# Patient Record
Sex: Male | Born: 1975 | Race: White | Hispanic: No | Marital: Married | Smoking: Never smoker
Health system: Southern US, Community
[De-identification: ages and names within clinical notes are randomized; demographics above are authoritative.]

## PROBLEM LIST (undated history)

## (undated) DIAGNOSIS — F909 Attention-deficit hyperactivity disorder, unspecified type: Secondary | ICD-10-CM

## (undated) DIAGNOSIS — J329 Chronic sinusitis, unspecified: Secondary | ICD-10-CM

## (undated) DIAGNOSIS — K219 Gastro-esophageal reflux disease without esophagitis: Secondary | ICD-10-CM

## (undated) HISTORY — PX: APPENDECTOMY: SHX54

---

## 2011-08-22 ENCOUNTER — Ambulatory Visit: Payer: Self-pay

## 2011-08-22 ENCOUNTER — Ambulatory Visit: Payer: Self-pay | Admitting: Emergency Medicine

## 2011-08-22 LAB — URINALYSIS, COMPLETE
Bacteria: NEGATIVE
Bilirubin,UR: NEGATIVE
Glucose,UR: NEGATIVE mg/dL (ref 0–75)
Ketone: NEGATIVE
Leukocyte Esterase: NEGATIVE
Nitrite: NEGATIVE
Ph: 6 (ref 4.5–8.0)
Protein: 30
Specific Gravity: >=1.03 (ref 1.003–1.030)
Squamous Epithelial: NONE SEEN

## 2011-08-23 LAB — URINE CULTURE

## 2012-01-20 ENCOUNTER — Ambulatory Visit: Payer: Self-pay | Admitting: Internal Medicine

## 2012-02-06 ENCOUNTER — Ambulatory Visit: Payer: Self-pay | Admitting: Internal Medicine

## 2012-02-16 ENCOUNTER — Ambulatory Visit: Payer: Self-pay

## 2012-02-16 LAB — RAPID STREP-A WITH REFLX: Micro Text Report: NEGATIVE

## 2012-04-17 ENCOUNTER — Ambulatory Visit: Payer: Self-pay | Admitting: Emergency Medicine

## 2013-12-05 IMAGING — US US PELVIS LIMITED
1 series · 14 of 25 positions shown · non-contrast
Comparison: none

REASON FOR EXAM: CR [id number] Sudden Rt Groin Pa[REDACTED]reased
Cremasteric  Reflex Rt Testicl...
COMMENTS:

[Series 1: us pelvis limited · 0.08mm/px · 14 of 78 slices shown]
[im 1/78]
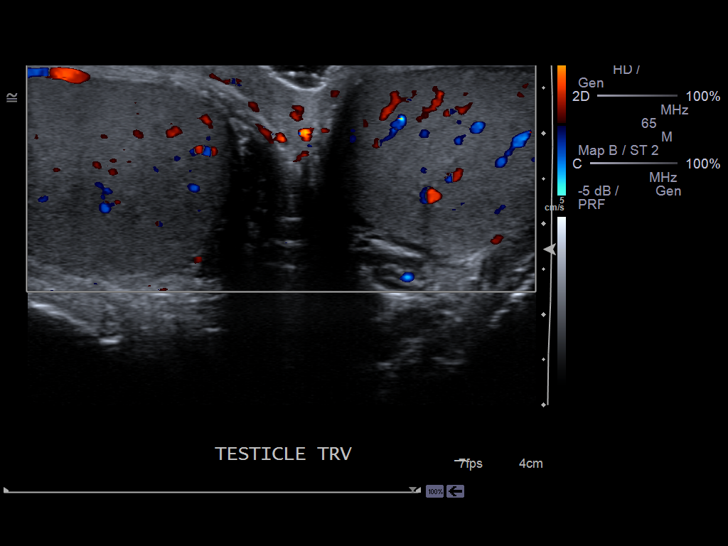
[im 7/78]
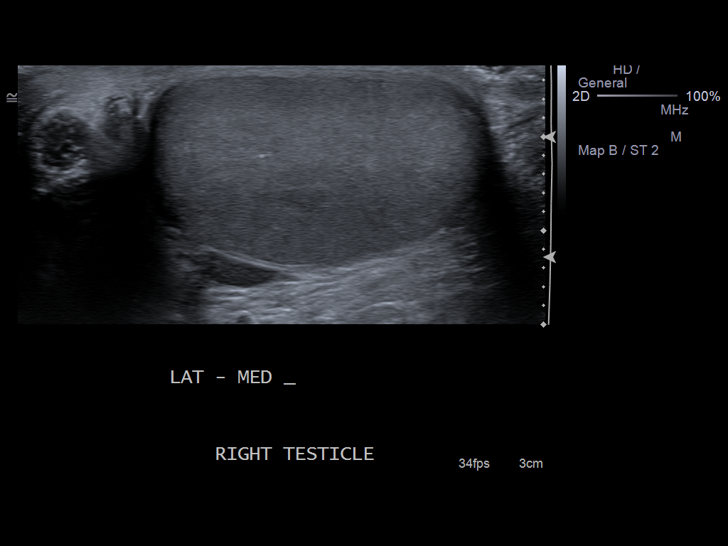
[im 13/78]
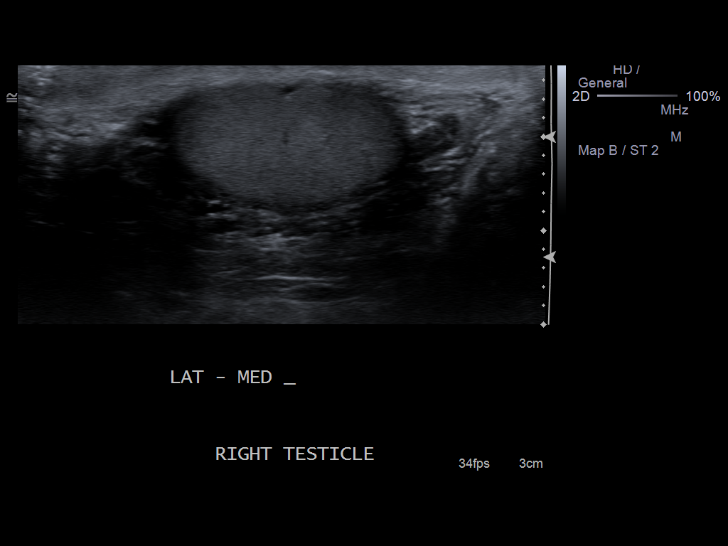
[im 20/78]
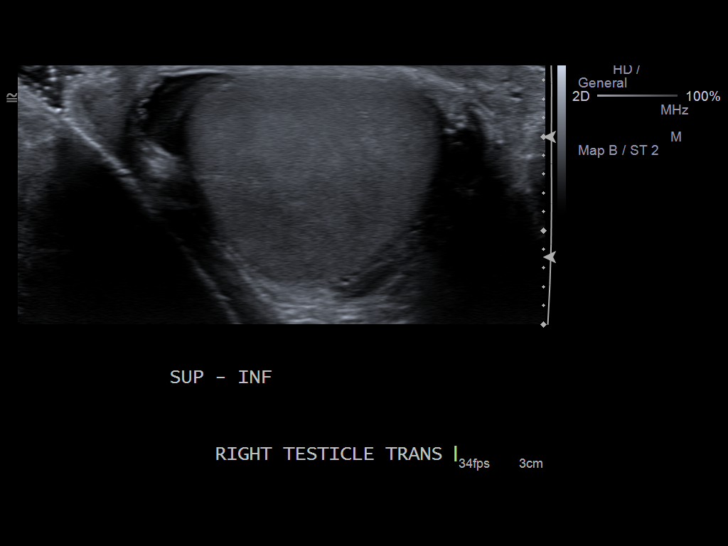
[im 26/78]
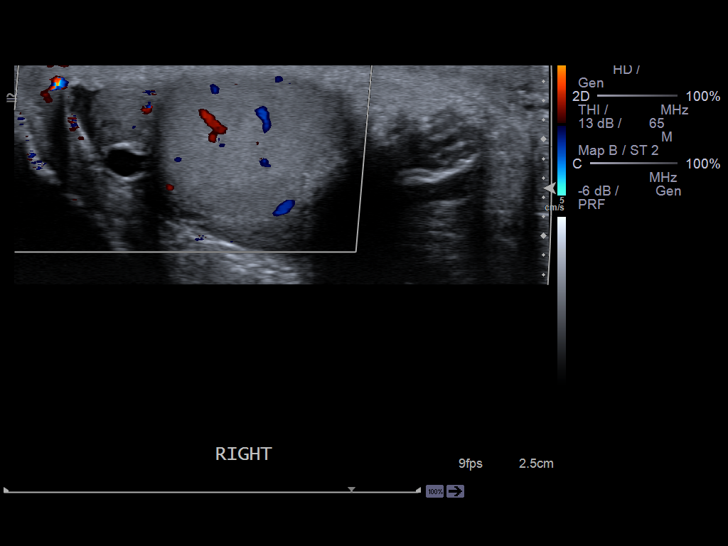
[im 29/78]
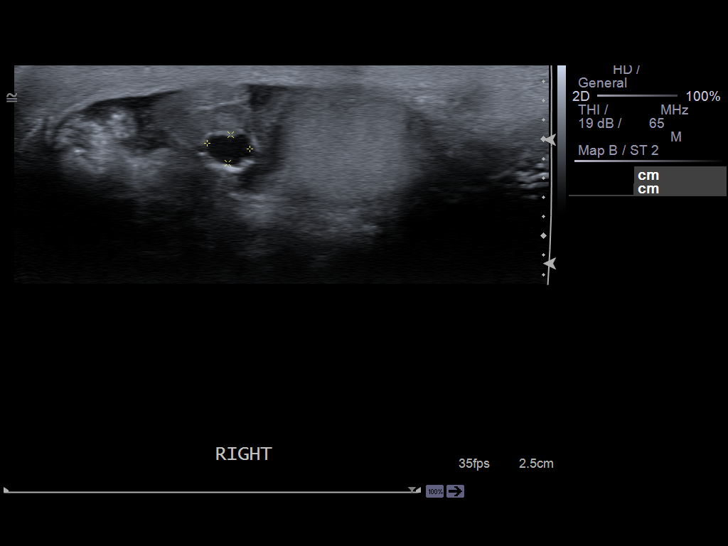
[im 36/78]
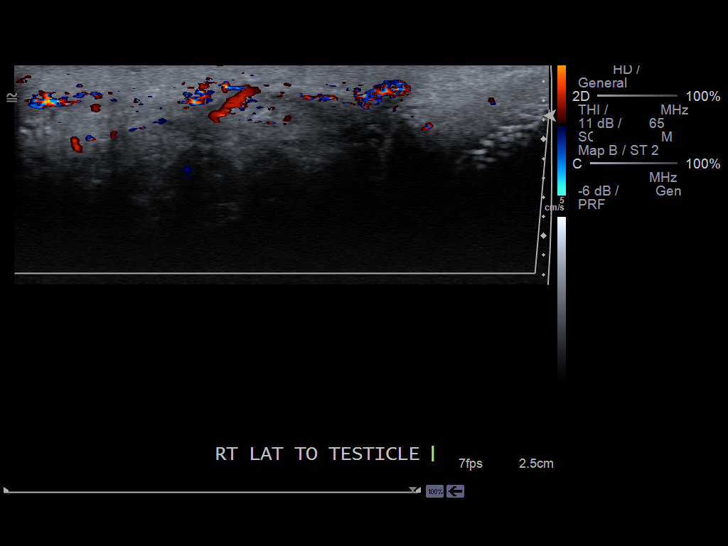
[im 42/78]
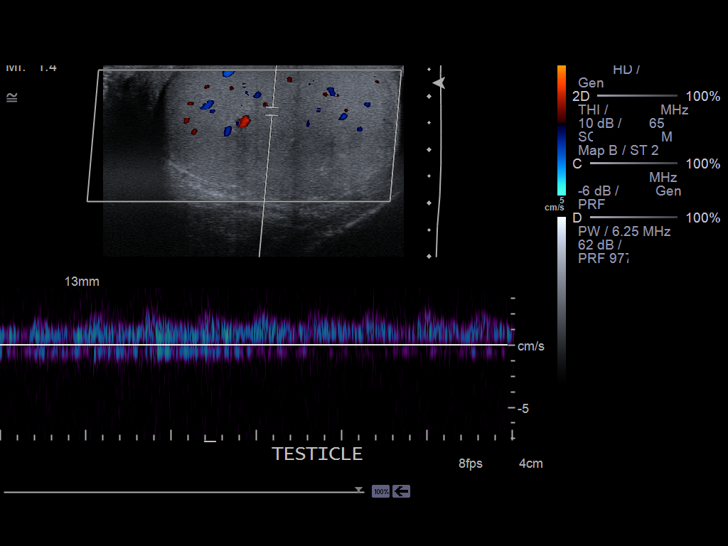
[im 49/78]
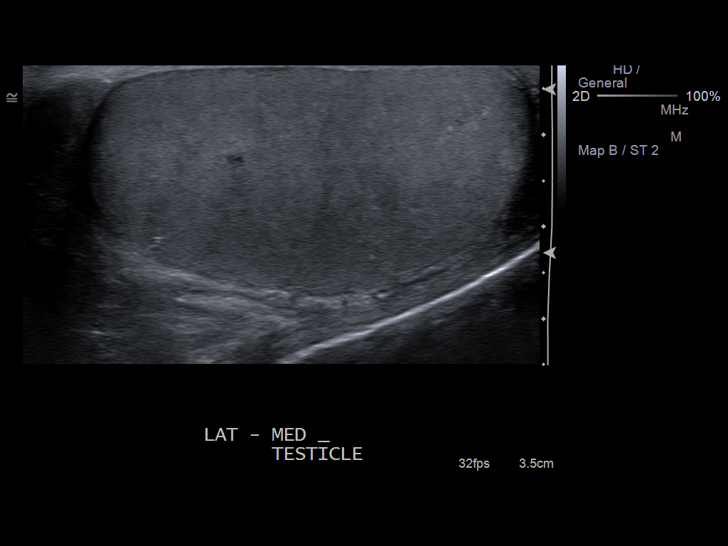
[im 52/78]
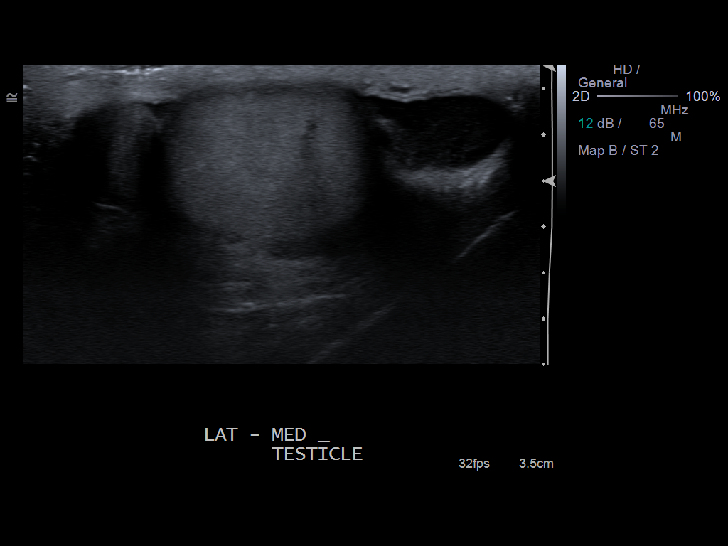
[im 58/78]
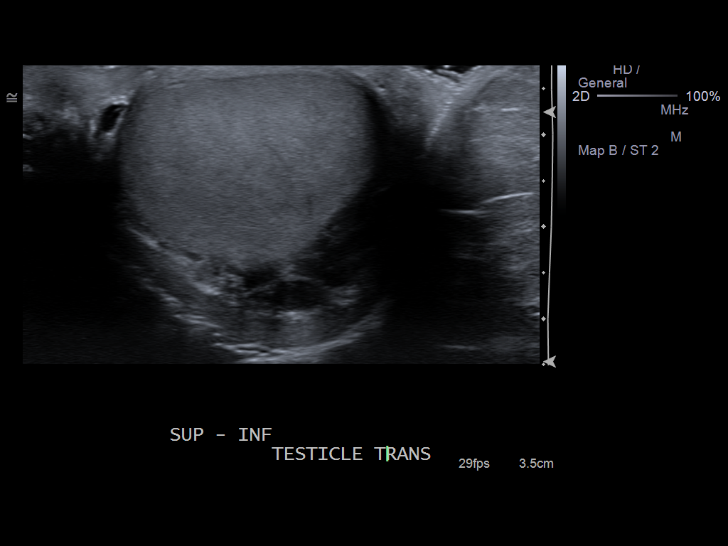
[im 65/78]
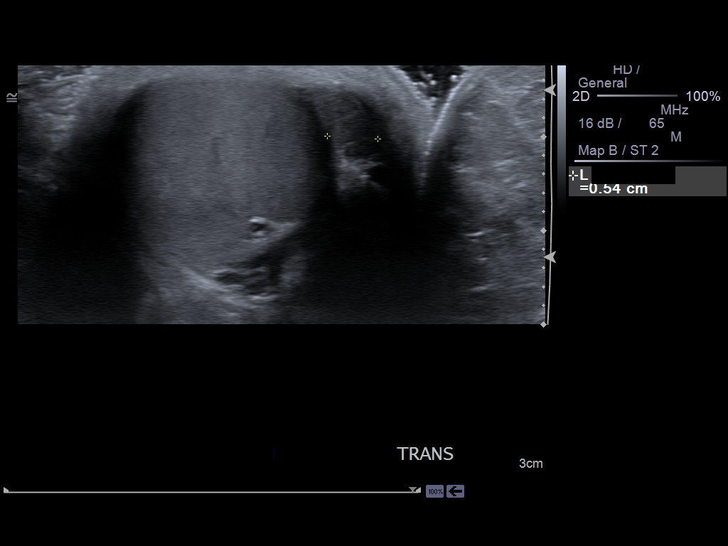
[im 71/78]
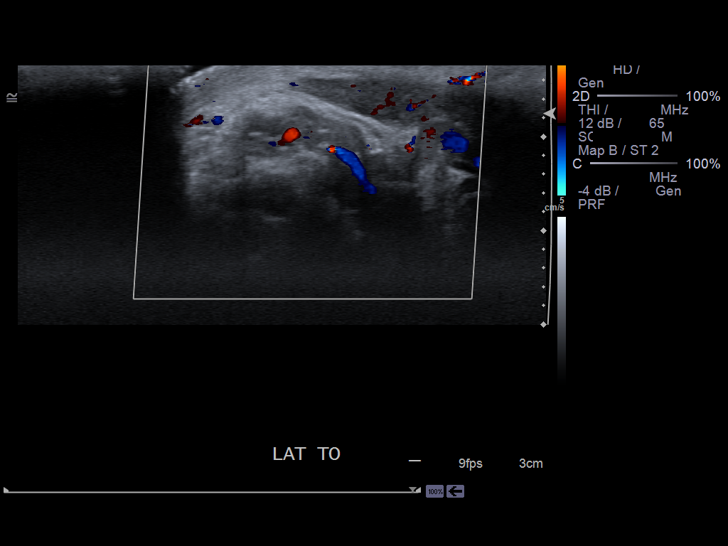
[im 78/78]
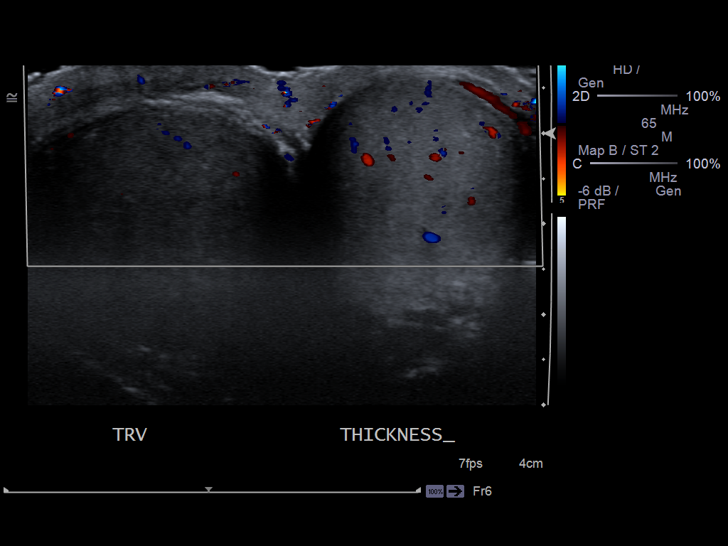

[14 of 25 positions shown; findings below may reference images not displayed]

PROCEDURE:     US  - US TESTICULAR  - August 22, 2011  [DATE]

RESULT:     The right testicle measures 4.79 cm x 3.56 cm x 2.37 cm and the
left testicle measures 4.59 cm x 2.73 cm x 2.56 cm. No intratesticular mass
is seen. Symmetrical vascular flow is observed in the testicles. No torsion
is identified. The epididymis is normal in size bilaterally. Incidental note
is made of a 1.1 cm, right epididymal cyst. A small, left varicocele is
observed.
IMPRESSION: 1.  No torsion or other acute change is identified.
2.  There is small, left varicocele.
3.  Incidental note is made of a 1.1 cm epididymal cyst on the right.

[REDACTED]

## 2014-05-05 IMAGING — CR RIGHT FOOT COMPLETE - 3+ VIEW
1 series · 3 of 3 positions shown · non-contrast
Comparison: none

REASON FOR EXAM: R foot laceration between 3rd and 4th toe due to injury
COMMENTS:

PROCEDURE:     MDR - MDR FOOT RT COMP W/OBLIQUES  - January 20, 2012 [DATE]
RESULT:     Right foot images demonstrate no definite fracture, dislocation
or radiopaque foreign body.

[Series 1: ap · 0.17mm/px · 3 of 3 slices shown]
[im 1/3]
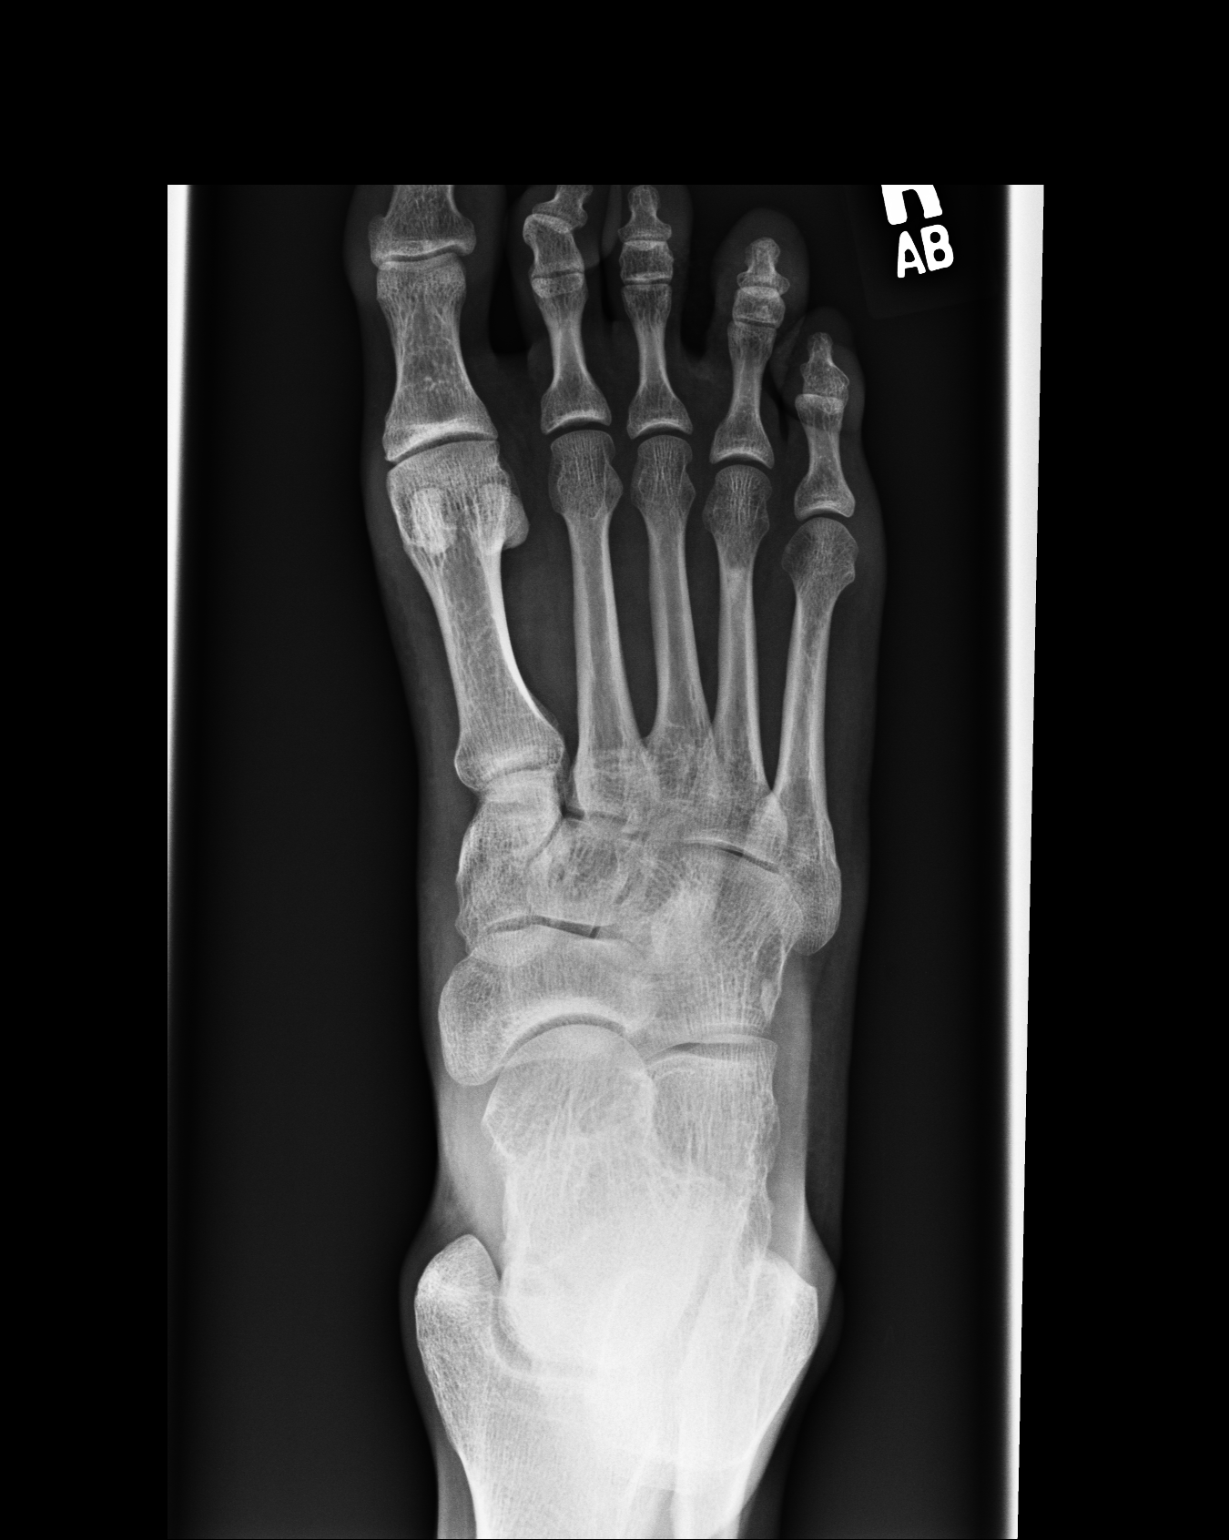
[im 2/3]
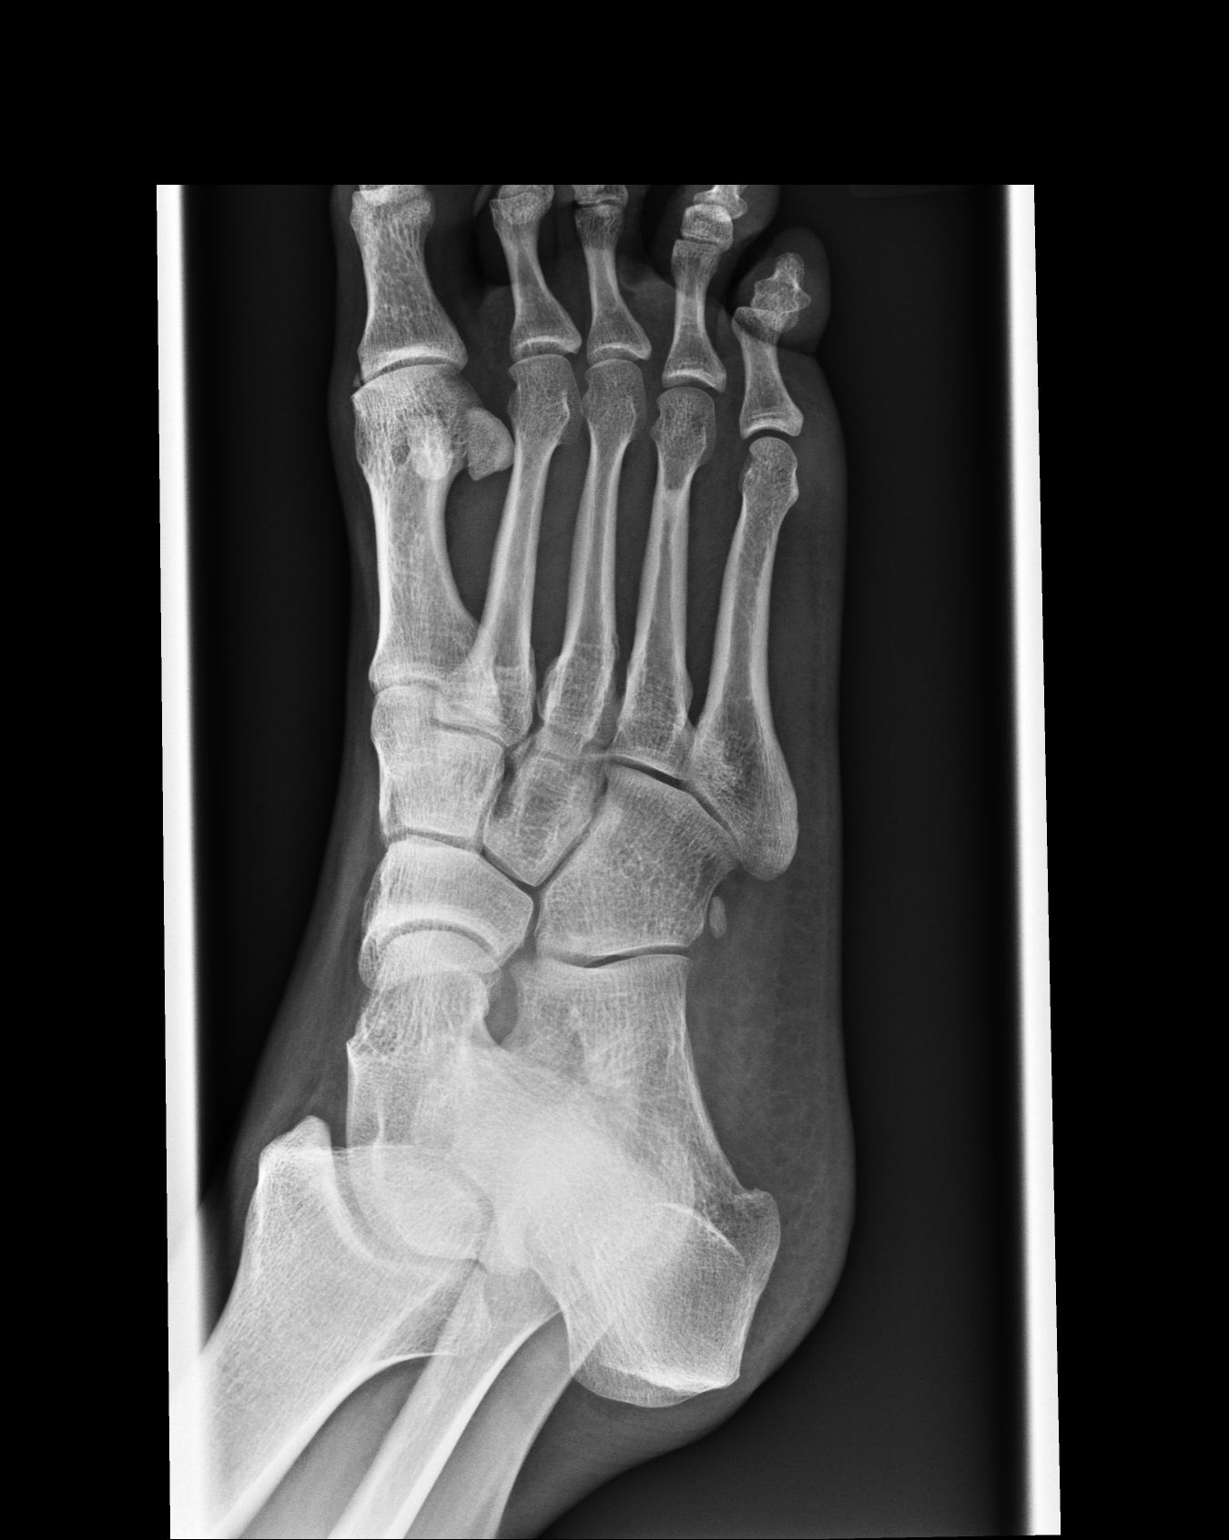
[im 3/3]
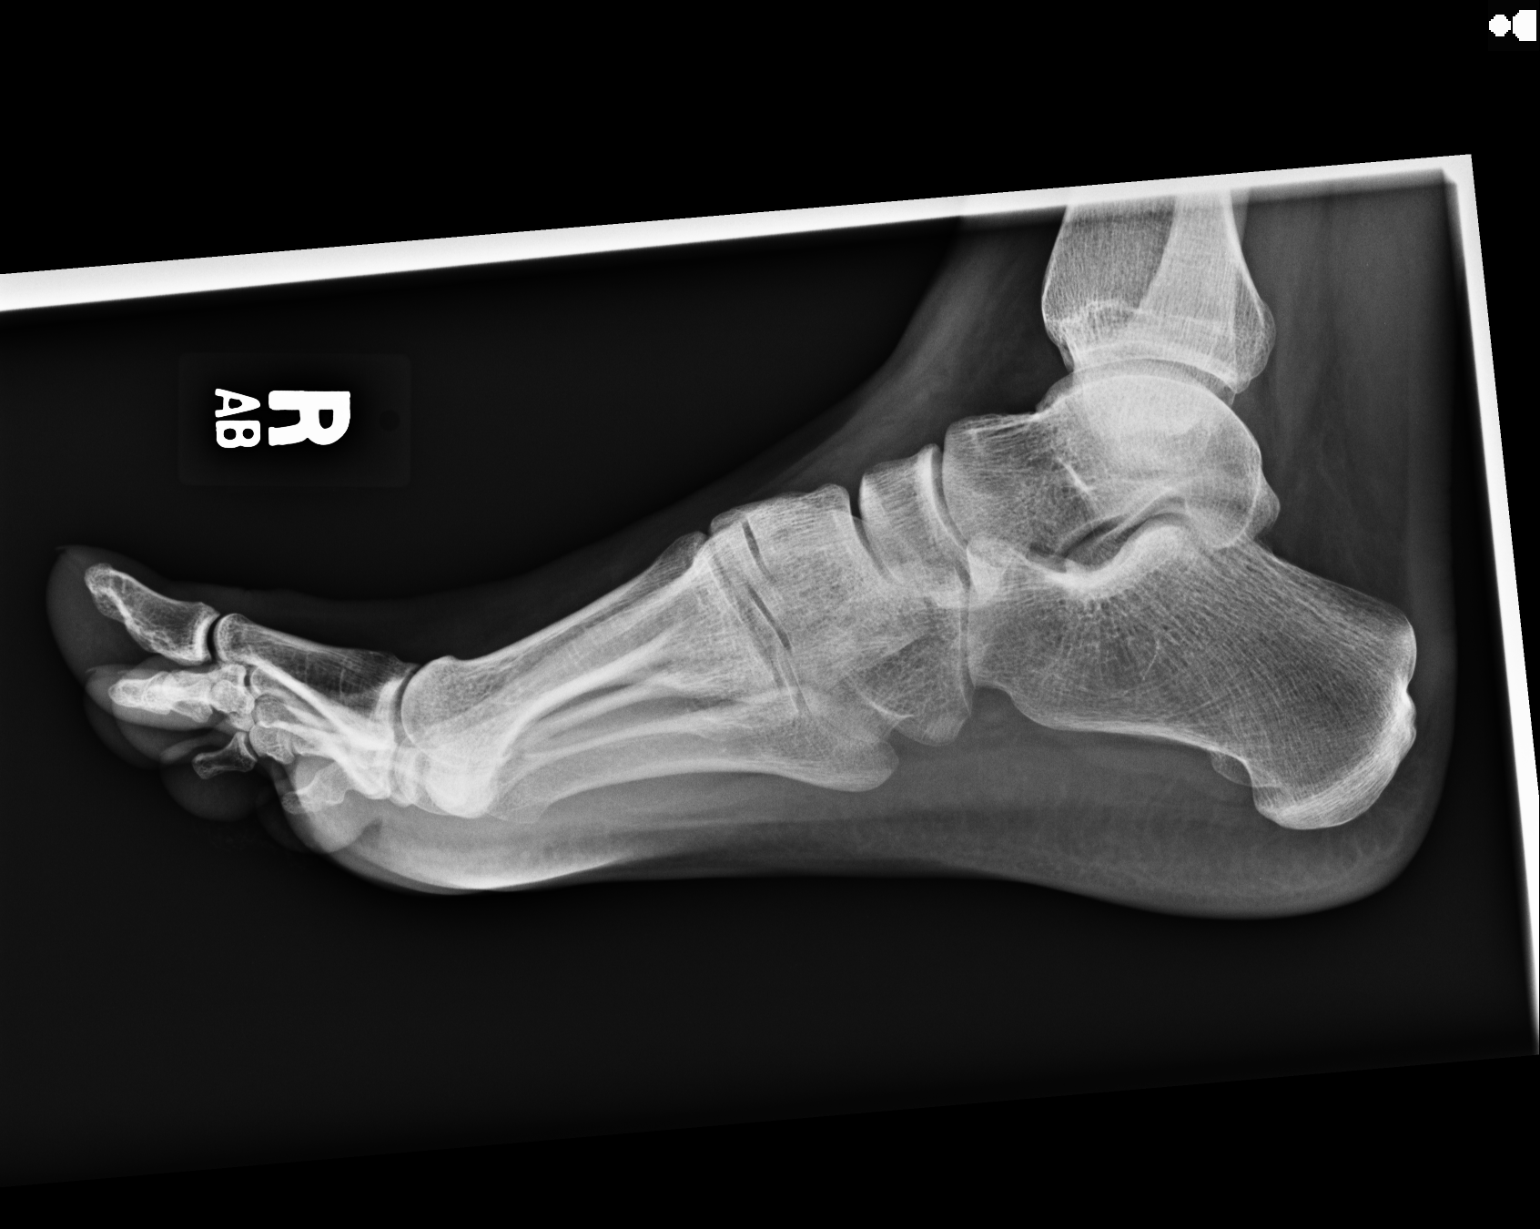

[3 of 3 positions shown; findings below may reference images not displayed]

IMPRESSION: Please see above.

[REDACTED]

## 2015-07-17 ENCOUNTER — Ambulatory Visit
Admission: EM | Admit: 2015-07-17 | Discharge: 2015-07-17 | Disposition: A | Discharge status: Home / Self Care | Payer: BLUE CROSS/BLUE SHIELD | Attending: Family Medicine | Admitting: Family Medicine

## 2015-07-17 ENCOUNTER — Encounter: Payer: Self-pay | Admitting: Emergency Medicine

## 2015-07-17 DIAGNOSIS — B349 Viral infection, unspecified: Secondary | ICD-10-CM

## 2015-07-17 HISTORY — DX: Gastro-esophageal reflux disease without esophagitis: K21.9

## 2015-07-17 HISTORY — DX: Attention-deficit hyperactivity disorder, unspecified type: F90.9

## 2015-07-17 HISTORY — DX: Chronic sinusitis, unspecified: J32.9

## 2015-07-17 LAB — RAPID INFLUENZA A&B ANTIGENS (ARMC ONLY): INFLUENZA B (ARMC): NEGATIVE

## 2015-07-17 LAB — RAPID INFLUENZA A&B ANTIGENS: Influenza A (ARMC): NEGATIVE

## 2015-07-17 MED ORDER — OSELTAMIVIR PHOSPHATE 75 MG PO CAPS: 75.0000 mg | ORAL_CAPSULE | Freq: Two times a day (BID) | ORAL | Status: DC

## 2015-07-17 MED ORDER — GUAIFENESIN-CODEINE 100-10 MG/5ML PO SOLN: ORAL | Status: DC

## 2015-07-17 NOTE — ED Notes (Signed)
Patient c/o bodyaches, HAs, fever, and sinus pressure since yesterday.

## 2015-07-17 NOTE — ED Provider Notes (Signed)
CSN: [id number]     Arrival date & time 07/17/15  [id number] History   First MD Initiated Contact with Patient 07/17/15 1018     Chief Complaint  Patient presents with  . Generalized Body Aches  . Headache  . Facial Pain   (Consider location/radiation/quality/duration/timing/severity/associated sxs/prior Treatment) Patient is a 40 y.o. male presenting with headaches and URI.  Headache Associated symptoms: fatigue, fever, myalgias and URI   URI Presenting symptoms: fatigue and fever   Severity:  Moderate Onset quality:  Sudden Timing:  Constant Progression:  Worsening Chronicity:  New Relieved by:  Nothing Ineffective treatments:  OTC medications Associated symptoms: arthralgias, headaches and myalgias   Risk factors: sick contacts   Risk factors: not elderly, no chronic cardiac disease, no chronic kidney disease, no chronic respiratory disease, no diabetes mellitus, no immunosuppression, no recent illness and no recent travel     Past Medical History  Diagnosis Date  . GERD (gastroesophageal reflux disease)   . Sinusitis   . ADHD (attention deficit hyperactivity disorder)    Past Surgical History  Procedure Laterality Date  . Appendectomy     History reviewed. No pertinent family history. Social History  Substance Use Topics  . Smoking status: Never Smoker   . Smokeless tobacco: None  . Alcohol Use: Yes    Review of Systems  Constitutional: Positive for fever and fatigue.  Musculoskeletal: Positive for myalgias and arthralgias.  Neurological: Positive for headaches.    Allergies  Review of patient's allergies indicates no known allergies.  Home Medications   Prior to Admission medications   Medication Sig Start Date End Date Taking? Authorizing Provider  Azelastine-Fluticasone (DYMISTA) 137-50 MCG/ACT SUSP Place 2 drops into the nose daily.   Yes Historical Provider, MD  buPROPion (WELLBUTRIN XL) 150 MG 24 hr tablet Take 150 mg by mouth daily.   Yes Historical  Provider, MD  levocetirizine (XYZAL) 5 MG tablet Take 5 mg by mouth every evening.   Yes Historical Provider, MD  loratadine-pseudoephedrine (CLARITIN-D 24-HOUR) 10-240 MG 24 hr tablet Take 1 tablet by mouth daily.   Yes Historical Provider, MD  methylphenidate 27 MG PO CR tablet Take 27 mg by mouth every morning.   Yes Historical Provider, MD  guaiFENesin-codeine 100-10 MG/5ML syrup 10 ml po qhs prn 07/17/15   Payton Mccallumrlando Nour Scalise, MD  oseltamivir (TAMIFLU) 75 MG capsule Take 1 capsule (75 mg total) by mouth 2 (two) times daily. 07/17/15   Payton Mccallumrlando Gottlieb Zuercher, MD   Meds Ordered and Administered this Visit  Medications - No data to display  BP 117/74 mmHg  Pulse 100  Temp(Src) 99.8 F (37.7 C) (Tympanic)  Resp 16  Ht 6' (1.829 m)  Wt 190 lb (86.183 kg)  BMI 25.76 kg/m2  SpO2 100% No data found.   Physical Exam  Constitutional: He appears well-developed and well-nourished. No distress.  HENT:  Head: Normocephalic and atraumatic.  Right Ear: Tympanic membrane, external ear and ear canal normal.  Left Ear: Tympanic membrane, external ear and ear canal normal.  Nose: Rhinorrhea present.  Mouth/Throat: Uvula is midline, oropharynx is clear and moist and mucous membranes are normal. No oropharyngeal exudate, posterior oropharyngeal edema, posterior oropharyngeal erythema or tonsillar abscesses.  Eyes: Conjunctivae and EOM are normal. Pupils are equal, round, and reactive to light. Right eye exhibits no discharge. Left eye exhibits no discharge. No scleral icterus.  Neck: Normal range of motion. Neck supple. No tracheal deviation present. No thyromegaly present.  Cardiovascular: Normal rate, regular rhythm and normal heart  sounds.   Pulmonary/Chest: Effort normal and breath sounds normal. No stridor. No respiratory distress. He has no wheezes. He has no rales. He exhibits no tenderness.  Lymphadenopathy:    He has no cervical adenopathy.  Neurological: He is alert.  Skin: Skin is warm and dry. No rash  noted. He is not diaphoretic.  Nursing note and vitals reviewed.   ED Course  Procedures (including critical care time)  Labs Review Labs Reviewed  RAPID INFLUENZA A&B ANTIGENS [facility] ONLY)    Imaging Review No results found.   Visual Acuity Review  Right Eye Distance:   Left Eye Distance:   Bilateral Distance:    Right Eye Near:   Left Eye Near:    Bilateral Near:         MDM   1. Viral syndrome   (likely flu)  1. Lab results and diagnosis reviewed with patient 2. rx as per orders above; reviewed possible side effects, interactions, risks and benefits  3. Recommend supportive treatment with increased fluids, rest, otc analgesics prn 4. Follow-up prn if symptoms worsen or don't improve    Payton Mccallum, MD 07/17/15 1105

## 2016-05-02 ENCOUNTER — Ambulatory Visit
Admission: EM | Admit: 2016-05-02 | Discharge: 2016-05-02 | Disposition: A | Discharge status: Home / Self Care | Payer: Managed Care, Other (non HMO) | Attending: Family Medicine | Admitting: Family Medicine

## 2016-05-02 ENCOUNTER — Encounter: Payer: Self-pay | Admitting: Emergency Medicine

## 2016-05-02 DIAGNOSIS — J0100 Acute maxillary sinusitis, unspecified: Secondary | ICD-10-CM | POA: Diagnosis not present

## 2016-05-02 MED ORDER — AMOXICILLIN 875 MG PO TABS
875.0000 mg | ORAL_TABLET | Freq: Two times a day (BID) | ORAL | 0 refills | Status: DC
Start: 2016-05-02 — End: 2017-03-26

## 2016-05-02 NOTE — ED Triage Notes (Signed)
Patient c/o cough and chest congestion for about 2 weeks.  Patient c/o left ear pain and HAs that started yesterday.

## 2016-05-02 NOTE — ED Provider Notes (Signed)
[facility]    CSN: [id number] Arrival date & time: 05/02/16  1459     History   Chief Complaint Chief Complaint  Patient presents with  . Cough  . Otalgia    HPI Brady Banks is a 41 y.o. male.   The history is provided by the patient.  URI  Presenting symptoms: congestion, cough, facial pain and fatigue   Severity:  Moderate Onset quality:  Sudden Duration:  2 weeks Timing:  Constant Progression:  Worsening Chronicity:  New Relieved by:  Nothing Ineffective treatments:  OTC medications Associated symptoms: headaches and sinus pain   Risk factors: not elderly, no chronic cardiac disease, no chronic kidney disease, no chronic respiratory disease, no diabetes mellitus, no immunosuppression, no recent illness and no recent travel     Past Medical History:  Diagnosis Date  . ADHD (attention deficit hyperactivity disorder)   . GERD (gastroesophageal reflux disease)   . Sinusitis     There are no active problems to display for this patient.   Past Surgical History:  Procedure Laterality Date  . APPENDECTOMY         Home Medications    Prior to Admission medications   Medication Sig Start Date End Date Taking? Authorizing Provider  esomeprazole (NEXIUM) 20 MG capsule Take 20 mg by mouth daily at 12 noon.   Yes Historical Provider, MD  methylphenidate 36 MG PO CR tablet Take 36 mg by mouth daily.   Yes Historical Provider, MD  amoxicillin (AMOXIL) 875 MG tablet Take 1 tablet (875 mg total) by mouth 2 (two) times daily. 05/02/16   Payton Mccallumrlando Jaeceon Michelin, MD  buPROPion (WELLBUTRIN XL) 150 MG 24 hr tablet Take 150 mg by mouth daily.    Historical Provider, MD  levocetirizine (XYZAL) 5 MG tablet Take 5 mg by mouth every evening.    Historical Provider, MD  loratadine-pseudoephedrine (CLARITIN-D 24-HOUR) 10-240 MG 24 hr tablet Take 1 tablet by mouth daily.    Historical Provider, MD    Family History History reviewed. No pertinent family history.  Social  History Social History  Substance Use Topics  . Smoking status: Never Smoker  . Smokeless tobacco: Never Used  . Alcohol use Yes     Allergies   Patient has no known allergies.   Review of Systems Review of Systems  Constitutional: Positive for fatigue.  HENT: Positive for congestion and sinus pain.   Respiratory: Positive for cough.   Neurological: Positive for headaches.     Physical Exam Triage Vital Signs ED Triage Vitals  Enc Vitals Group     BP 05/02/16 1514 134/82     Pulse Rate 05/02/16 1514 (!) 104     Resp 05/02/16 1514 16     Temp 05/02/16 1514 99 F (37.2 C)     Temp Source 05/02/16 1514 Oral     SpO2 05/02/16 1514 100 %     Weight 05/02/16 1515 190 lb (86.2 kg)     Height 05/02/16 1515 6' (1.829 m)     Head Circumference --      Peak Flow --      Pain Score 05/02/16 1517 3     Pain Loc --      Pain Edu? --      Excl. in GC? --    No data found.   Updated Vital Signs BP 134/82 (BP Location: Left Arm)   Pulse (!) 104   Temp 99 F (37.2 C) (Oral)   Resp 16  Ht 6' (1.829 m)   Wt 190 lb (86.2 kg)   SpO2 100%   BMI 25.77 kg/m   Visual Acuity Right Eye Distance:   Left Eye Distance:   Bilateral Distance:    Right Eye Near:   Left Eye Near:    Bilateral Near:     Physical Exam  Constitutional: He appears well-developed and well-nourished. No distress.  HENT:  Head: Normocephalic and atraumatic.  Right Ear: Tympanic membrane, external ear and ear canal normal.  Left Ear: Tympanic membrane, external ear and ear canal normal.  Nose: Right sinus exhibits maxillary sinus tenderness and frontal sinus tenderness. Left sinus exhibits maxillary sinus tenderness and frontal sinus tenderness.  Mouth/Throat: Uvula is midline, oropharynx is clear and moist and mucous membranes are normal. No oropharyngeal exudate or tonsillar abscesses.  Eyes: Conjunctivae and EOM are normal. Pupils are equal, round, and reactive to light. Right eye exhibits no  discharge. Left eye exhibits no discharge. No scleral icterus.  Neck: Normal range of motion. Neck supple. No tracheal deviation present. No thyromegaly present.  Cardiovascular: Normal rate, regular rhythm and normal heart sounds.   Pulmonary/Chest: Effort normal and breath sounds normal. No stridor. No respiratory distress. He has no wheezes. He has no rales. He exhibits no tenderness.  Lymphadenopathy:    He has no cervical adenopathy.  Neurological: He is alert.  Skin: Skin is warm and dry. No rash noted. He is not diaphoretic.  Nursing note and vitals reviewed.    UC Treatments / Results  Labs (all labs ordered are listed, but only abnormal results are displayed) Labs Reviewed - No data to display  EKG  EKG Interpretation None       Radiology No results found.  Procedures Procedures (including critical care time)  Medications Ordered in UC Medications - No data to display   Initial Impression / Assessment and Plan / UC Course  I have reviewed the triage vital signs and the nursing notes.  Pertinent labs & imaging results that were available during my care of the patient were reviewed by me and considered in my medical decision making (see chart for details).       Final Clinical Impressions(s) / UC Diagnoses   Final diagnoses:  Acute maxillary sinusitis, recurrence not specified    New Prescriptions Discharge Medication List as of 05/02/2016  3:51 PM    START taking these medications   Details  amoxicillin (AMOXIL) 875 MG tablet Take 1 tablet (875 mg total) by mouth 2 (two) times daily., Starting Fri 05/02/2016, Normal       1. diagnosis reviewed with patient 2. rx as per orders above; reviewed possible side effects, interactions, risks and benefits  3. Recommend supportive treatment with otc flonase 4. Follow-up prn if symptoms worsen or don't improve   Payton Mccallum, MD 05/02/16 1557

## 2016-07-01 ENCOUNTER — Ambulatory Visit
Admission: EM | Admit: 2016-07-01 | Discharge: 2016-07-01 | Discharge status: Absent without leave | Payer: Managed Care, Other (non HMO)

## 2017-03-26 ENCOUNTER — Encounter: Payer: Self-pay | Admitting: *Deleted

## 2017-03-26 ENCOUNTER — Ambulatory Visit
Admission: EM | Admit: 2017-03-26 | Discharge: 2017-03-26 | Disposition: A | Discharge status: Home / Self Care | Payer: Managed Care, Other (non HMO) | Attending: Family Medicine | Admitting: Family Medicine

## 2017-03-26 DIAGNOSIS — J020 Streptococcal pharyngitis: Secondary | ICD-10-CM

## 2017-03-26 DIAGNOSIS — J029 Acute pharyngitis, unspecified: Secondary | ICD-10-CM

## 2017-03-26 LAB — RAPID STREP SCREEN (MED CTR MEBANE ONLY): STREPTOCOCCUS, GROUP A SCREEN (DIRECT): POSITIVE — AB

## 2017-03-26 MED ORDER — ACETAMINOPHEN 500 MG PO TABS
1000.0000 mg | ORAL_TABLET | Freq: Once | ORAL | Status: AC
  Administered 2017-03-26: 1000 mg via ORAL

## 2017-03-26 MED ORDER — AMOXICILLIN 875 MG PO TABS: 875.0000 mg | ORAL_TABLET | Freq: Two times a day (BID) | ORAL | 0 refills | Status: DC

## 2017-03-26 NOTE — ED Provider Notes (Signed)
[facility]    CSN: [id number] Arrival date & time: 03/26/17  1152     History   Chief Complaint Chief Complaint  Patient presents with  . Fever  . Fatigue  . Cough  . Generalized Body Aches  . Chills    HPI Brady Banks is a 41 y.o. male.   41 yo male with a c/o sore throat, bodyaches, fevers, chills since last night.    The history is provided by the patient.    Past Medical History:  Diagnosis Date  . ADHD (attention deficit hyperactivity disorder)   . GERD (gastroesophageal reflux disease)   . Sinusitis     There are no active problems to display for this patient.   Past Surgical History:  Procedure Laterality Date  . APPENDECTOMY         Home Medications    Prior to Admission medications   Medication Sig Start Date End Date Taking? Authorizing Provider  buPROPion (WELLBUTRIN XL) 150 MG 24 hr tablet Take 150 mg by mouth daily.   Yes [provider]  esomeprazole (NEXIUM) 20 MG capsule Take 20 mg by mouth daily at 12 noon.   Yes [provider]  levocetirizine (XYZAL) 5 MG tablet Take 5 mg by mouth every evening.   Yes [provider]  methylphenidate 36 MG PO CR tablet Take 36 mg by mouth daily.   Yes [provider]  montelukast (SINGULAIR) 10 MG tablet Take 10 mg by mouth at bedtime.   Yes [provider]  amoxicillin (AMOXIL) 875 MG tablet Take 1 tablet (875 mg total) by mouth 2 (two) times daily. 03/26/17   Payton Mccallumonty, Darlinda Bellows, MD  loratadine-pseudoephedrine (CLARITIN-D 24-HOUR) 10-240 MG 24 hr tablet Take 1 tablet by mouth daily.    [provider]    Family History Family History  Problem Relation Age of Onset  . Hepatitis C Father     Social History Social History   Tobacco Use  . Smoking status: Never Smoker  . Smokeless tobacco: Never Used  Substance Use Topics  . Alcohol use: Yes  . Drug use: Not on file     Allergies   Patient has no known  allergies.   Review of Systems Review of Systems   Physical Exam Triage Vital Signs ED Triage Vitals  Enc Vitals Group     BP 03/26/17 1220 (!) 127/96     Pulse Rate 03/26/17 1220 (!) 132     Resp 03/26/17 1220 16     Temp 03/26/17 1220 (!) 101.1 F (38.4 C)     Temp Source 03/26/17 1220 Oral     SpO2 03/26/17 1220 96 %     Weight 03/26/17 1224 200 lb (90.7 kg)     Height 03/26/17 1224 6' (1.829 m)     Head Circumference --      Peak Flow --      Pain Score --      Pain Loc --      Pain Edu? --      Excl. in GC? --    No data found.  Updated Vital Signs BP (!) 127/96 (BP Location: Left Arm)   Pulse (!) 132   Temp (!) 101.1 F (38.4 C) (Oral)   Resp 16   Ht 6' (1.829 m)   Wt 200 lb (90.7 kg)   SpO2 96%   BMI 27.12 kg/m   Visual Acuity Right Eye Distance:   Left Eye Distance:  Bilateral Distance:    Right Eye Near:   Left Eye Near:    Bilateral Near:     Physical Exam  Constitutional: He appears well-developed and well-nourished. No distress.  HENT:  Head: Normocephalic and atraumatic.  Right Ear: Tympanic membrane, external ear and ear canal normal.  Left Ear: Tympanic membrane, external ear and ear canal normal.  Nose: Nose normal.  Mouth/Throat: Uvula is midline and mucous membranes are normal. Posterior oropharyngeal erythema present. No oropharyngeal exudate, posterior oropharyngeal edema or tonsillar abscesses. No tonsillar exudate.  Neck: Normal range of motion. Neck supple. No tracheal deviation present. No thyromegaly present.  Cardiovascular: Normal rate, regular rhythm and normal heart sounds.  Pulmonary/Chest: Effort normal and breath sounds normal. No stridor. No respiratory distress. He has no wheezes. He has no rales. He exhibits no tenderness.  Lymphadenopathy:    He has no cervical adenopathy.  Neurological: He is alert.  Skin: Skin is warm and dry. No rash noted. He is not diaphoretic.  Nursing note and vitals reviewed.    UC  Treatments / Results  Labs (all labs ordered are listed, but only abnormal results are displayed) Labs Reviewed  RAPID STREP SCREEN (NOT AT [facility]) - Abnormal; Notable for the following components:      Result Value   Streptococcus, Group A Screen (Direct) POSITIVE (*)    All other components within normal limits    EKG  EKG Interpretation None       Radiology No results found.  Procedures Procedures (including critical care time)  Medications Ordered in UC Medications  acetaminophen (TYLENOL) tablet 1,000 mg (1,000 mg Oral Given 03/26/17 1233)     Initial Impression / Assessment and Plan / UC Course  I have reviewed the triage vital signs and the nursing notes.  Pertinent labs & imaging results that were available during my care of the patient were reviewed by me and considered in my medical decision making (see chart for details).       Final Clinical Impressions(s) / UC Diagnoses   Final diagnoses:  Streptococcal sore throat    ED Discharge Orders        Ordered    amoxicillin (AMOXIL) 875 MG tablet  2 times daily     03/26/17 1243     1. Lab results and diagnosis reviewed with patient 2. rx as per orders above; reviewed possible side effects, interactions, risks and benefits  3. Recommend supportive treatment with otc analgesics prn, salt water gargles  4. Follow-up prn if symptoms worsen or don't improve  Controlled Substance Prescriptions [place] Controlled Substance Registry consulted? Not Applicable   Payton Mccallumonty, Jerren Flinchbaugh, MD 03/26/17 1251

## 2017-03-26 NOTE — ED Triage Notes (Signed)
Fever, fatigue, sore throat, productive cough- yellow, chills, body aches, onset last night.

## 2018-05-14 ENCOUNTER — Ambulatory Visit
Admission: EM | Admit: 2018-05-14 | Discharge: 2018-05-14 | Disposition: A | Discharge status: Home / Self Care | Payer: BLUE CROSS/BLUE SHIELD | Attending: Family Medicine | Admitting: Family Medicine

## 2018-05-14 ENCOUNTER — Other Ambulatory Visit: Payer: Self-pay

## 2018-05-14 ENCOUNTER — Encounter: Payer: Self-pay | Admitting: Emergency Medicine

## 2018-05-14 DIAGNOSIS — J029 Acute pharyngitis, unspecified: Secondary | ICD-10-CM | POA: Diagnosis not present

## 2018-05-14 DIAGNOSIS — J020 Streptococcal pharyngitis: Secondary | ICD-10-CM | POA: Diagnosis not present

## 2018-05-14 DIAGNOSIS — B9789 Other viral agents as the cause of diseases classified elsewhere: Secondary | ICD-10-CM

## 2018-05-14 DIAGNOSIS — R05 Cough: Secondary | ICD-10-CM | POA: Diagnosis not present

## 2018-05-14 DIAGNOSIS — J111 Influenza due to unidentified influenza virus with other respiratory manifestations: Secondary | ICD-10-CM

## 2018-05-14 DIAGNOSIS — R69 Illness, unspecified: Principal | ICD-10-CM

## 2018-05-14 LAB — RAPID STREP SCREEN (MED CTR MEBANE ONLY): STREPTOCOCCUS, GROUP A SCREEN (DIRECT): POSITIVE — AB

## 2018-05-14 MED ORDER — AMOXICILLIN 875 MG PO TABS: 875.0000 mg | ORAL_TABLET | Freq: Two times a day (BID) | ORAL | 0 refills | Status: DC

## 2018-05-14 MED ORDER — BALOXAVIR MARBOXIL(80 MG DOSE) 2 X 40 MG PO TBPK: 80.0000 mg | ORAL_TABLET | Freq: Once | ORAL | 0 refills | Status: AC

## 2018-05-14 MED ORDER — ACETAMINOPHEN 500 MG PO TABS
1000.0000 mg | ORAL_TABLET | Freq: Once | ORAL | Status: AC
  Administered 2018-05-14: 1000 mg via ORAL

## 2018-05-14 NOTE — ED Provider Notes (Signed)
[facility] ____________________________________________  Time seen: Approximately 3:32 PM  I have reviewed the triage vital signs and the nursing notes.   HISTORY  Chief Complaint Cough (APPT); Generalized Body Aches; and Fever   HPI Brady Banks is a 43 y.o. male presenting for evaluation of 2 days of cough and sore throat complaints.  Reports accompanying body aches and fever.  Reports T-max 101.6.  Has taken over-the-counter Tylenol and ibuprofen with some improvement but no resolution.  Last took Advil a few hours prior to arrival.  States his daughter sick with some similar complaints.  Denies chest pain or shortness of breath or abdominal pain.  Overall has continued to eat and drink.  Reports otherwise doing well denies other complaints.  States her throat currently mild.    Past Medical History:  Diagnosis Date  . ADHD (attention deficit hyperactivity disorder)   . GERD (gastroesophageal reflux disease)   . Sinusitis     There are no active problems to display for this patient.   Past Surgical History:  Procedure Laterality Date  . APPENDECTOMY       No current facility-administered medications for this encounter.   Current Outpatient Medications:  .  buPROPion (WELLBUTRIN XL) 150 MG 24 hr tablet, Take 150 mg by mouth daily., Disp: , Rfl:  .  esomeprazole (NEXIUM) 20 MG capsule, Take 20 mg by mouth daily at 12 noon., Disp: , Rfl:  .  levocetirizine (XYZAL) 5 MG tablet, Take 5 mg by mouth every evening., Disp: , Rfl:  .  montelukast (SINGULAIR) 10 MG tablet, Take 10 mg by mouth at bedtime., Disp: , Rfl:  .  amoxicillin (AMOXIL) 875 MG tablet, Take 1 tablet (875 mg total) by mouth 2 (two) times daily., Disp: 20 tablet, Rfl: 0 .  Azelastine HCl 0.15 % SOLN, USE 2 SPRAYS IN EACH NOSTRIL TWICE A DAY WITH FLONASE, Disp: , Rfl:  .  Baloxavir Marboxil,80 MG Dose, (XOFLUZA) 2 x 40 MG TBPK, Take 80 mg by mouth once for 1 dose., Disp: 2 each, Rfl: 0 .   dexmethylphenidate (FOCALIN) 5 MG tablet, TAKE 2 TABLETS BY MOUTH EVERY MORNING AND TAKE 1 TABLET EVERY AFTERNOON, Disp: , Rfl:  .  fluticasone (FLONASE) 50 MCG/ACT nasal spray, 1 spray by Each Nare route daily., Disp: , Rfl:  .  metoprolol succinate (TOPROL-XL) 25 MG 24 hr tablet, Take 25 mg by mouth daily., Disp: , Rfl:  .  tamsulosin (FLOMAX) 0.4 MG CAPS capsule, Take 0.4 mg by mouth daily., Disp: , Rfl:   Allergies Patient has no known allergies.  Family History  Problem Relation Age of Onset  . Hepatitis C Father     Social History Social History   Tobacco Use  . Smoking status: Never Smoker  . Smokeless tobacco: Never Used  Substance Use Topics  . Alcohol use: Yes  . Drug use: Not on file    Review of Systems Constitutional: Positive fever ENT: positive sore throat.  No nasal congestion. Cardiovascular: Denies chest pain. Respiratory: Denies shortness of breath. Gastrointestinal: No abdominal pain.  Musculoskeletal: Negative for back pain. Skin: Negative for rash.   ____________________________________________   PHYSICAL EXAM:  VITAL SIGNS: ED Triage Vitals  Enc Vitals Group     BP 05/14/18 1511 119/78     Pulse Rate 05/14/18 1511 100     Resp 05/14/18 1511 16     Temp 05/14/18 1511 (!) 101.5 F (38.6 C)     Temp Source 05/14/18 1511 Oral  SpO2 05/14/18 1511 98 %     Weight 05/14/18 1507 205 lb (93 kg)     Height 05/14/18 1507 6' (1.829 m)     Head Circumference --      Peak Flow --      Pain Score 05/14/18 1507 3     Pain Loc --      Pain Edu? --      Excl. in GC? --    Constitutional: Alert and oriented. Well appearing and in no acute distress. Eyes: Conjunctivae are normal.  Head: Atraumatic. No sinus tenderness to palpation. No swelling. No erythema.  Ears: no erythema, normal TMs bilaterally.   Nose:No nasal congestion   Mouth/Throat: Mucous membranes are moist. Mild pharyngeal erythema. No tonsillar swelling or exudate.  Neck: No stridor.   No cervical spine tenderness to palpation. Hematological/Lymphatic/Immunilogical: No cervical lymphadenopathy. Cardiovascular: Normal rate, regular rhythm. Grossly normal heart sounds.  Good peripheral circulation. Respiratory: Normal respiratory effort.  No retractions. No wheezes, rales or rhonchi. Good air movement.  Musculoskeletal: Ambulatory with steady gait Neurologic:  Normal speech and language. No gait instability. Skin:  Skin appears warm, dry and intact. No rash noted. Psychiatric: Mood and affect are normal. Speech and behavior are normal. ___________________________________________   LABS (all labs ordered are listed, but only abnormal results are displayed)  Labs Reviewed  RAPID STREP SCREEN ([facility] ONLY) - Abnormal; Notable for the following components:      Result Value   Streptococcus, Group A Screen (Direct) POSITIVE (*)    All other components within normal limits    PROCEDURES Procedures    INITIAL IMPRESSION / ASSESSMENT AND PLAN / ED COURSE  Pertinent labs & imaging results that were available during my care of the patient were reviewed by me and considered in my medical decision making (see chart for details).  Overall well-appearing patient.  No acute distress.  Suspect influenza-like illness, however also concern for strep.  Strep positive.  Due to concern with continued cough, discussed with patient will treat with oral Xofluza and amoxicillin.  Encouraged rest, fluids, supportive care, Tylenol ibuprofen.Discussed indication, risks and benefits of medications with patient.  Discussed follow up with Primary care physician this week. Discussed follow up and return parameters including no resolution or any worsening concerns. Patient verbalized understanding and agreed to plan.   ____________________________________________   FINAL CLINICAL IMPRESSION(S) / ED DIAGNOSES  Final diagnoses:  Influenza-like illness  Streptococcal sore throat      ED Discharge Orders         Ordered    amoxicillin (AMOXIL) 875 MG tablet  2 times daily     05/14/18 1556    Baloxavir Marboxil,80 MG Dose, (XOFLUZA) 2 x 40 MG TBPK   Once     05/14/18 1556           Note: This dictation was prepared with Dragon dictation along with smaller phrase technology. Any transcriptional errors that result from this process are unintentional.         Renford DillsMiller, Artyom Stencel, NP 05/14/18 [id number]

## 2018-05-14 NOTE — ED Notes (Signed)
Tamiflu called in per lindsey miller since xofluza was out of stock

## 2018-05-14 NOTE — ED Triage Notes (Signed)
Patient c/o cough, congestion, fever and bodyaches that started on Tuesday.

## 2018-05-14 NOTE — Discharge Instructions (Signed)
Take medication as prescribed. Rest. Drink plenty of fluids.  Alternate over-the-counter Tylenol and ibuprofen as discussed.  Follow up with your primary care physician this week as needed. Return to Urgent care for new or worsening concerns.

## 2019-07-11 ENCOUNTER — Other Ambulatory Visit: Payer: Self-pay

## 2019-07-11 ENCOUNTER — Ambulatory Visit: Payer: BLUE CROSS/BLUE SHIELD | Attending: Internal Medicine

## 2019-07-11 DIAGNOSIS — Z23 Encounter for immunization: Secondary | ICD-10-CM

## 2019-07-11 NOTE — Progress Notes (Signed)
   Covid-19 Vaccination Clinic  Name:  Brady Banks    MRN: [id number] DOB: January 02, 1976  07/11/2019  [name] was observed post Covid-19 immunization for 15 minutes without incident. He was provided with Vaccine Information Sheet and instruction to access the V-Safe system.   [name] was instructed to call 911 with any severe reactions post vaccine: Marland Kitchen Difficulty breathing  . Swelling of face and throat  . A fast heartbeat  . A bad rash all over body  . Dizziness and weakness   Immunizations Administered    Name Date Dose VIS Date Route   Pfizer COVID-19 Vaccine 07/11/2019  9:54 AM 0.3 mL 03/18/2019 Intramuscular   Manufacturer: [employer], [employer]   Lot: [id number]   NDC: [address]-3

## 2019-08-03 ENCOUNTER — Ambulatory Visit: Payer: BLUE CROSS/BLUE SHIELD

## 2019-08-10 ENCOUNTER — Other Ambulatory Visit: Payer: Self-pay

## 2019-08-10 ENCOUNTER — Ambulatory Visit: Payer: BLUE CROSS/BLUE SHIELD | Attending: Internal Medicine

## 2019-08-10 DIAGNOSIS — Z23 Encounter for immunization: Secondary | ICD-10-CM

## 2019-08-10 NOTE — Progress Notes (Signed)
   Covid-19 Vaccination Clinic  Name:  DARICK FETTERS    MRN: [id number] DOB: April 21, 1975  08/10/2019  Mr. Mincey was observed post Covid-19 immunization for 15 minutes without incident. He was provided with Vaccine Information Sheet and instruction to access the V-Safe system.   Mr. Poole was instructed to call 911 with any severe reactions post vaccine: Marland Kitchen Difficulty breathing  . Swelling of face and throat  . A fast heartbeat  . A bad rash all over body  . Dizziness and weakness   Immunizations Administered    Name Date Dose VIS Date Route   Pfizer COVID-19 Vaccine 08/10/2019  9:07 AM 0.3 mL 06/01/2018 Intramuscular   Manufacturer: [employer], [employer]   Lot: [id number]   NDC: [address]-3

## 2020-04-02 ENCOUNTER — Ambulatory Visit
Admission: RE | Admit: 2020-04-02 | Discharge: 2020-04-02 | Disposition: A | Discharge status: Home / Self Care | Payer: BC Managed Care – PPO | Source: Ambulatory Visit | Attending: Emergency Medicine | Admitting: Emergency Medicine

## 2020-04-02 ENCOUNTER — Other Ambulatory Visit: Payer: Self-pay

## 2020-04-02 VITALS — BP 117/92 | HR 82 | Temp 98.9°F | Resp 18

## 2020-04-02 DIAGNOSIS — J069 Acute upper respiratory infection, unspecified: Secondary | ICD-10-CM | POA: Insufficient documentation

## 2020-04-02 DIAGNOSIS — Z20822 Contact with and (suspected) exposure to covid-19: Secondary | ICD-10-CM | POA: Insufficient documentation

## 2020-04-02 LAB — RESP PANEL BY RT-PCR (FLU A&B, COVID) ARPGX2
Influenza A by PCR: NEGATIVE
Influenza B by PCR: NEGATIVE
SARS Coronavirus 2 by RT PCR: NEGATIVE

## 2020-04-02 LAB — GROUP A STREP BY PCR: Group A Strep by PCR: NOT DETECTED

## 2020-04-02 NOTE — ED Provider Notes (Signed)
[facility]    CSN: [id number] Arrival date & time: 04/02/20  1504      History   Chief Complaint Chief Complaint  Patient presents with   Fever   Sore Throat    HPI Brady Banks is a 43 y.o. male.   HPI   44 year old male here for evaluation of malaise, nasal congestion, facial pressure, sore throat, and a productive cough.  Patient reports that his symptoms been going on for last 4 days.  He states that he has had elevated temp in the 99 degree range.  His nasal discharge is a light yellow and he has had some popping in his right ear.  Patient denies shortness of breath or wheezing, GI complaints, body aches, or changes to his sense of taste or smell.    Past Medical History:  Diagnosis Date   ADHD (attention deficit hyperactivity disorder)    GERD (gastroesophageal reflux disease)    Sinusitis     There are no problems to display for this patient.   Past Surgical History:  Procedure Laterality Date   APPENDECTOMY         Home Medications    Prior to Admission medications   Medication Sig Start Date End Date Taking? Authorizing Provider  Azelastine HCl 0.15 % SOLN USE 2 SPRAYS IN EACH NOSTRIL TWICE A DAY WITH FLONASE 03/18/18  Yes [provider]  buPROPion (WELLBUTRIN XL) 150 MG 24 hr tablet Take 150 mg by mouth daily.   Yes [provider]  dexmethylphenidate (FOCALIN) 5 MG tablet TAKE 2 TABLETS BY MOUTH EVERY MORNING AND TAKE 1 TABLET EVERY AFTERNOON 05/03/18  Yes [provider]  esomeprazole (NEXIUM) 20 MG capsule Take 20 mg by mouth daily at 12 noon.   Yes [provider]  fluticasone (FLONASE) 50 MCG/ACT nasal spray 1 spray by Each Nare route daily.   Yes [provider]  levocetirizine (XYZAL) 5 MG tablet Take 5 mg by mouth every evening.   Yes [provider]  metoprolol succinate (TOPROL-XL) 25 MG 24 hr tablet Take 25 mg by mouth daily. 04/15/18  Yes [provider]   montelukast (SINGULAIR) 10 MG tablet Take 10 mg by mouth at bedtime.   Yes [provider]  tamsulosin (FLOMAX) 0.4 MG CAPS capsule Take 0.4 mg by mouth daily. 03/18/18  Yes [provider]  amoxicillin (AMOXIL) 875 MG tablet Take 1 tablet (875 mg total) by mouth 2 (two) times daily. 05/14/18   Marylene Land, NP    Family History Family History  Problem Relation Age of Onset   Hepatitis C Father     Social History Social History   Tobacco Use   Smoking status: Never Smoker   Smokeless tobacco: Never Used  Vaping Use   Vaping Use: Never used  Substance Use Topics   Alcohol use: Yes     Allergies   Patient has no known allergies.   Review of Systems Review of Systems  Constitutional: Negative for activity change and fever.  HENT: Positive for congestion, rhinorrhea and sinus pressure. Negative for ear pain, sinus pain and sore throat.   Respiratory: Positive for cough. Negative for shortness of breath and wheezing.   Gastrointestinal: Negative for diarrhea, nausea and vomiting.  Musculoskeletal: Negative for arthralgias and myalgias.  Skin: Negative for rash.  Hematological: Negative.   Psychiatric/Behavioral: Negative.      Physical Exam Triage Vital Signs ED Triage Vitals  Enc Vitals Group     BP  04/02/20 1545 (!) 117/92     Pulse Rate 04/02/20 1545 82     Resp 04/02/20 1545 18     Temp 04/02/20 1545 98.9 F (37.2 C)     Temp Source 04/02/20 1545 Oral     SpO2 04/02/20 1545 97 %     Weight --      Height --      Head Circumference --      Peak Flow --      Pain Score 04/02/20 1538 4     Pain Loc --      Pain Edu? --      Excl. in [place]? --    No data found.  Updated Vital Signs BP (!) 117/92 (BP Location: Left Wrist)    Pulse 82    Temp 98.9 F (37.2 C) (Oral)    Resp 18    SpO2 97%   Visual Acuity Right Eye Distance:   Left Eye Distance:   Bilateral Distance:    Right Eye Near:   Left Eye Near:    Bilateral Near:      Physical Exam Vitals and nursing note reviewed.  Constitutional:      General: He is not in acute distress.    Appearance: He is well-developed and normal weight. He is not toxic-appearing.  HENT:     Head: Normocephalic and atraumatic.     Right Ear: Tympanic membrane and ear canal normal. No middle ear effusion. Tympanic membrane is not erythematous.     Left Ear: Tympanic membrane and ear canal normal.  No middle ear effusion. Tympanic membrane is not erythematous.     Nose: Congestion and rhinorrhea present.     Mouth/Throat:     Mouth: Mucous membranes are moist.     Pharynx: Oropharynx is clear. Posterior oropharyngeal erythema present. No oropharyngeal exudate.     Tonsils: No tonsillar exudate. 0 on the right. 0 on the left.  Cardiovascular:     Rate and Rhythm: Normal rate and regular rhythm.     Heart sounds: Normal heart sounds. No murmur heard. No gallop.   Pulmonary:     Effort: Pulmonary effort is normal.     Breath sounds: Normal breath sounds. No wheezing, rhonchi or rales.  Musculoskeletal:     Cervical back: Normal range of motion and neck supple.  Lymphadenopathy:     Cervical: No cervical adenopathy.  Skin:    General: Skin is warm and dry.     Capillary Refill: Capillary refill takes less than 2 seconds.     Findings: No erythema or rash.  Neurological:     General: No focal deficit present.     Mental Status: He is alert and oriented to person, place, and time.  Psychiatric:        Mood and Affect: Mood normal.        Behavior: Behavior normal.      UC Treatments / Results  Labs (all labs ordered are listed, but only abnormal results are displayed) Labs Reviewed  GROUP A STREP BY PCR  RESP PANEL BY RT-PCR (FLU A&B, COVID) [id number]    EKG   Radiology No results found.  Procedures Procedures (including critical care time)  Medications Ordered in UC Medications - No data to display  Initial Impression / Assessment and Plan / UC Course   I have reviewed the triage vital signs and the nursing notes.  Pertinent labs & imaging results that were available during my care of the  patient were reviewed by me and considered in my medical decision making (see chart for details).   Is here for evaluation of upper respiratory complaint has been going on for last 4 days.  Physical exam reveals inflamed and erythematous nasal mucosa with clear to yellow nasal discharge.  No sinus tenderness to percussion.  Posterior oropharynx is mildly erythematous and with clear postnasal drip.  Tonsillar pillars are unremarkable without erythema, edema, or exudate.  Lungs are clear to auscultation in all fields.  No cervical lymphadenopathy appreciated on exam.  Patient's physical exam is consistent with a viral upper respiratory infection.  I offered patient cough medicine which he declined.   Final Clinical Impressions(s) / UC Diagnoses   Final diagnoses:  Upper respiratory tract infection, unspecified type     Discharge Instructions     Perform sinus irrigation twice daily with a NeilMed sinus rinse kit and distilled water.  Continue to use NyQuil as well as Tylenol and Advil Cold and Sinus.  If you develop any new, or worsening symptoms, return for reevaluation    ED Prescriptions    None     PDMP not reviewed this encounter.   Margarette Canada, NP 04/02/20 1649

## 2020-04-02 NOTE — Discharge Instructions (Addendum)
Perform sinus irrigation twice daily with a NeilMed sinus rinse kit and distilled water.  Continue to use NyQuil as well as Tylenol and Advil Cold and Sinus.  If you develop any new, or worsening symptoms, return for reevaluation

## 2020-04-02 NOTE — ED Triage Notes (Signed)
Pt c/o malaise onset Wednesday, congestion, productive cough with white sputum, facial pressure onset Thursday. Sore throat onset Friday 12/24. Feels his sinuses are inflamed 2/2 not "normal response to sinus irrigation". Also reports low-grade fever. Took at home COVID test two days ago (negative). Reports h/o strep throat twice in past 10 years. Denies n/v/d, SOB.  Last took tylenol/ibuprofen yesterday; nyquil last night.

## 2024-02-25 ENCOUNTER — Encounter: Payer: Self-pay | Admitting: Emergency Medicine

## 2024-02-25 ENCOUNTER — Ambulatory Visit
Admission: EM | Admit: 2024-02-25 | Discharge: 2024-02-25 | Disposition: A | Discharge status: Home / Self Care | Attending: Emergency Medicine | Admitting: Emergency Medicine

## 2024-02-25 DIAGNOSIS — J3489 Other specified disorders of nose and nasal sinuses: Secondary | ICD-10-CM

## 2024-02-25 DIAGNOSIS — J320 Chronic maxillary sinusitis: Secondary | ICD-10-CM | POA: Diagnosis not present

## 2024-02-25 MED ORDER — AMOXICILLIN-POT CLAVULANATE 875-125 MG PO TABS: 1.0000 | ORAL_TABLET | Freq: Two times a day (BID) | ORAL | 0 refills | Status: AC

## 2024-02-25 NOTE — ED Provider Notes (Signed)
 [facility]    CSN: [id number] Arrival date & time: 02/25/24  9061      History   Chief Complaint Chief Complaint  Patient presents with   Dizziness   sinus pressure    HPI Brady Banks is a 48 y.o. male.   HPI  48 year old male with past medical history significant for chronic sinusitis, GERD, ADHD presents for evaluation of fatigue, sinus pressure, and intermittent dizziness.  He has also had some sinus drainage with irrigation, aching in both of his shoulders, and nausea without vomiting.  He denies any fever, nasal discharge, or cough.  His wife is also been ill.  Past Medical History:  Diagnosis Date   ADHD (attention deficit hyperactivity disorder)    GERD (gastroesophageal reflux disease)    Sinusitis     There are no active problems to display for this patient.   Past Surgical History:  Procedure Laterality Date   APPENDECTOMY         Home Medications    Prior to Admission medications   Medication Sig Start Date End Date Taking? Authorizing Provider  amoxicillin -clavulanate (AUGMENTIN) 875-125 MG tablet Take 1 tablet by mouth every 12 (twelve) hours for 10 days. 02/25/24 03/06/24 Yes Bernardino Ditch, NP  lisdexamfetamine (VYVANSE) 50 MG capsule Take 50 mg by mouth daily.   Yes [provider]  Azelastine HCl 0.15 % SOLN USE 2 SPRAYS IN EACH NOSTRIL TWICE A DAY WITH FLONASE 03/18/18   [provider]  buPROPion (WELLBUTRIN XL) 150 MG 24 hr tablet Take 150 mg by mouth daily.    [provider]  esomeprazole (NEXIUM) 20 MG capsule Take 20 mg by mouth daily at 12 noon.    [provider]  fluticasone (FLONASE) 50 MCG/ACT nasal spray 1 spray by Each Nare route daily.    [provider]  levocetirizine (XYZAL) 5 MG tablet Take 5 mg by mouth every evening.    [provider]  metoprolol succinate (TOPROL-XL) 25 MG 24 hr tablet Take 25 mg by mouth daily. 04/15/18   [provider]   montelukast (SINGULAIR) 10 MG tablet Take 10 mg by mouth at bedtime. Patient not taking: Reported on 02/25/2024    [provider]  tamsulosin (FLOMAX) 0.4 MG CAPS capsule Take 0.4 mg by mouth daily. 03/18/18   [provider]    Family History Family History  Problem Relation Age of Onset   Hepatitis C Father     Social History Social History   Tobacco Use   Smoking status: Never   Smokeless tobacco: Never  Vaping Use   Vaping status: Never Used  Substance Use Topics   Alcohol use: Yes   Drug use: Never     Allergies   Patient has no known allergies.   Review of Systems Review of Systems  Constitutional:  Positive for fatigue. Negative for fever.  HENT:  Positive for congestion and sinus pressure. Negative for ear pain, rhinorrhea and sore throat.   Respiratory:  Negative for cough.   Gastrointestinal:  Positive for nausea. Negative for vomiting.  Musculoskeletal:  Positive for arthralgias.     Physical Exam Triage Vital Signs ED Triage Vitals  Encounter Vitals Group     BP      Girls Systolic BP Percentile      Girls Diastolic BP Percentile      Boys Systolic BP Percentile      Boys Diastolic BP Percentile      Pulse  Resp      Temp      Temp src      SpO2      Weight      Height      Head Circumference      Peak Flow      Pain Score      Pain Loc      Pain Education      Exclude from Growth Chart    No data found.  Updated Vital Signs BP 115/63 (BP Location: Right Arm)   Pulse 84   Temp 99.2 F (37.3 C) (Oral)   Resp 18   Ht 6' (1.829 m)   Wt 210 lb (95.3 kg)   SpO2 96%   BMI 28.48 kg/m   Visual Acuity Right Eye Distance:   Left Eye Distance:   Bilateral Distance:    Right Eye Near:   Left Eye Near:    Bilateral Near:     Physical Exam Vitals and nursing note reviewed.  Constitutional:      Appearance: Normal appearance. He is not ill-appearing.  HENT:     Head: Normocephalic and atraumatic.      Right Ear: Tympanic membrane, ear canal and external ear normal. There is no impacted cerumen.     Left Ear: Tympanic membrane, ear canal and external ear normal. There is no impacted cerumen.     Nose: Congestion and rhinorrhea present.     Comments: His mucosa is edematous with yellow discharge in both nares.  No tenderness to compression of bilateral frontal or maxillary sinuses.    Mouth/Throat:     Mouth: Mucous membranes are moist.     Pharynx: Oropharynx is clear. No oropharyngeal exudate or posterior oropharyngeal erythema.  Cardiovascular:     Rate and Rhythm: Normal rate and regular rhythm.     Pulses: Normal pulses.     Heart sounds: Normal heart sounds. No murmur heard.    No friction rub. No gallop.  Pulmonary:     Effort: Pulmonary effort is normal.     Breath sounds: Normal breath sounds. No wheezing, rhonchi or rales.  Musculoskeletal:     Cervical back: Normal range of motion and neck supple. No tenderness.  Lymphadenopathy:     Cervical: No cervical adenopathy.  Skin:    General: Skin is warm and dry.     Capillary Refill: Capillary refill takes less than 2 seconds.     Findings: No erythema or rash.  Neurological:     General: No focal deficit present.     Mental Status: He is alert and oriented to person, place, and time.      UC Treatments / Results  Labs (all labs ordered are listed, but only abnormal results are displayed) Labs Reviewed - No data to display  EKG   Radiology No results found.  Procedures Procedures (including critical care time)  Medications Ordered in UC Medications - No data to display  Initial Impression / Assessment and Plan / UC Course  I have reviewed the triage vital signs and the nursing notes.  Pertinent labs & imaging results that were available during my care of the patient were reviewed by me and considered in my medical decision making (see chart for details).   Patient is a nontoxic-appearing 48 year old male  presenting for evaluation of respiratory symptoms above.  He does have a history of chronic sinusitis as well as allergies and uses Flonase nasal spray, azelastine nasal spray, Xyzal, and Singulair for  his allergy symptoms.  His physical exam does reveal inflammation of his upper respiratory tract with thick yellow discharge in both nares.  His sinuses are nontender to compression but he reports that they are never tender when he has a sinus infection.  He does have a strep like odor on his breath but no evidence of tonsillar hypertrophy or exudate.  He reports that he has been experiencing nasal drainage at night that accumulates at the tip of his nose and has been causing a sore on the tip of his nose.  I do not visualize that at present.  He also reports that he has intermittent dizziness which may be associated with collection of middle ear fluid though he has no visible effusion with otoscopic examination of both tympanic membranes.  He also was able to clear his eustachian tubes using the Valsalva maneuver.  I will have him continue his sinus regimen and I will discharge him home on a 10-day course of Augmentin 875 mg for his chronic sinusitis.  He is to continue his sinus irrigation twice daily as he has been doing.  Return precautions reviewed.   Final Clinical Impressions(s) / UC Diagnoses   Final diagnoses:  Sinus pressure  Chronic maxillary sinusitis     Discharge Instructions      The Augmentin twice daily with food for 10 days for treatment of your sinusitis.  Perform sinus irrigation 2-3 times a day with a [employer]Med sinus rinse kit and distilled water.  Do not use tap water.  You can use plain over-the-counter Mucinex  every 6 hours to break up the stickiness of the mucus so your body can clear it.  Increase your oral fluid intake to thin out your mucus so that is also able for your body to clear more easily.  Take an over-the-counter probiotic, such as Culturelle-align-activia, 1  hour after each dose of antibiotic to prevent diarrhea.  If you develop any new or worsening symptoms return for reevaluation or see your primary care provider.      ED Prescriptions     Medication Sig Dispense Auth. Provider   amoxicillin -clavulanate (AUGMENTIN) 875-125 MG tablet Take 1 tablet by mouth every 12 (twelve) hours for 10 days. 20 tablet Bernardino Ditch, NP      PDMP not reviewed this encounter.   Bernardino Ditch, NP 02/25/24 1140

## 2024-02-25 NOTE — Discharge Instructions (Addendum)
The Augmentin twice daily with food for 10 days for treatment of your sinusitis.  Perform sinus irrigation 2-3 times a day with a NeilMed sinus rinse kit and distilled water.  Do not use tap water.  You can use plain over-the-counter Mucinex every 6 hours to break up the stickiness of the mucus so your body can clear it.  Increase your oral fluid intake to thin out your mucus so that is also able for your body to clear more easily.  Take an over-the-counter probiotic, such as Culturelle-align-activia, 1 hour after each dose of antibiotic to prevent diarrhea.  If you develop any new or worsening symptoms return for reevaluation or see your primary care provider.  

## 2024-02-25 NOTE — ED Triage Notes (Addendum)
 Pt st's he thinks he has a sinus infection.  Pt c/o fatigued, sinus pressure and feeling dizzy  Pt also c/o sinus drainage, aching in bil shoulder, nausea without vomiting
